# Patient Record
Sex: Female | Born: 1960 | Race: White | Hispanic: No | Marital: Single | Smoking: Never smoker
Health system: Southern US, Community
[De-identification: ages and names within clinical notes are randomized; demographics above are authoritative.]

## PROBLEM LIST (undated history)

## (undated) DIAGNOSIS — F419 Anxiety disorder, unspecified: Secondary | ICD-10-CM

## (undated) DIAGNOSIS — M069 Rheumatoid arthritis, unspecified: Secondary | ICD-10-CM

## (undated) DIAGNOSIS — Z8661 Personal history of infections of the central nervous system: Secondary | ICD-10-CM

## (undated) DIAGNOSIS — Z86010 Personal history of colonic polyps: Secondary | ICD-10-CM

## (undated) DIAGNOSIS — E119 Type 2 diabetes mellitus without complications: Secondary | ICD-10-CM

## (undated) DIAGNOSIS — I10 Essential (primary) hypertension: Secondary | ICD-10-CM

## (undated) HISTORY — DX: Personal history of colonic polyps: Z86.010

## (undated) HISTORY — PX: BREAST CYST EXCISION: SHX579

## (undated) HISTORY — DX: Rheumatoid arthritis, unspecified: M06.9

## (undated) HISTORY — PX: CYST EXCISION: SHX5701

## (undated) HISTORY — DX: Anxiety disorder, unspecified: F41.9

## (undated) HISTORY — PX: MOUTH SURGERY: SHX715

## (undated) HISTORY — PX: CHOLECYSTECTOMY: SHX55

---

## 2009-04-03 ENCOUNTER — Ambulatory Visit (HOSPITAL_COMMUNITY): Admission: RE | Admit: 2009-04-03 | Discharge: 2009-04-03 | Payer: Self-pay | Admitting: General Surgery

## 2009-05-05 ENCOUNTER — Ambulatory Visit (HOSPITAL_COMMUNITY): Admission: RE | Admit: 2009-05-05 | Discharge: 2009-05-05 | Payer: Self-pay | Admitting: General Surgery

## 2009-11-28 ENCOUNTER — Ambulatory Visit: Payer: Self-pay | Admitting: Cardiology

## 2010-06-07 LAB — HEPATIC FUNCTION PANEL
ALT: 19 U/L (ref 0–35)
AST: 19 U/L (ref 0–37)
Albumin: 3.5 g/dL (ref 3.5–5.2)
Alkaline Phosphatase: 78 U/L (ref 39–117)
Total Bilirubin: 0.6 mg/dL (ref 0.3–1.2)

## 2010-06-07 LAB — CBC
HCT: 37.2 % (ref 36.0–46.0)
Hemoglobin: 12 g/dL (ref 12.0–15.0)
WBC: 11.9 10*3/uL — ABNORMAL HIGH (ref 4.0–10.5)

## 2010-06-07 LAB — BASIC METABOLIC PANEL
GFR calc non Af Amer: >60 mL/min (ref >60)
Potassium: 3.9 mEq/L (ref 3.5–5.1)
Sodium: 136 mEq/L (ref 135–145)

## 2010-06-11 LAB — CBC
Hemoglobin: 12.1 g/dL (ref 12.0–15.0)
MCHC: 33 g/dL (ref 30.0–36.0)
RDW: 14.9 % (ref 11.5–15.5)

## 2010-06-11 LAB — BASIC METABOLIC PANEL
BUN: 6 mg/dL (ref 6–23)
CO2: 26 mEq/L (ref 19–32)
Calcium: 9.3 mg/dL (ref 8.4–10.5)
Creatinine, Ser: 0.51 mg/dL (ref 0.4–1.2)
Glucose, Bld: 149 mg/dL — ABNORMAL HIGH (ref 70–99)

## 2010-06-11 LAB — GLUCOSE, CAPILLARY: Glucose-Capillary: 140 mg/dL — ABNORMAL HIGH (ref 70–99)

## 2010-06-11 LAB — HEPATIC FUNCTION PANEL
Alkaline Phosphatase: 70 U/L (ref 39–117)
Indirect Bilirubin: 0.9 mg/dL (ref 0.3–0.9)
Total Protein: 6.9 g/dL (ref 6.0–8.3)

## 2015-09-25 ENCOUNTER — Emergency Department (HOSPITAL_COMMUNITY)
Admission: EM | Admit: 2015-09-25 | Discharge: 2015-09-25 | Disposition: A | Discharge status: Home / Self Care | Payer: Medicare Other | Attending: Emergency Medicine | Admitting: Emergency Medicine

## 2015-09-25 ENCOUNTER — Emergency Department (HOSPITAL_COMMUNITY): Payer: Medicare Other

## 2015-09-25 ENCOUNTER — Encounter (HOSPITAL_COMMUNITY): Payer: Self-pay | Admitting: *Deleted

## 2015-09-25 DIAGNOSIS — E119 Type 2 diabetes mellitus without complications: Secondary | ICD-10-CM | POA: Insufficient documentation

## 2015-09-25 DIAGNOSIS — M170 Bilateral primary osteoarthritis of knee: Secondary | ICD-10-CM | POA: Diagnosis not present

## 2015-09-25 DIAGNOSIS — I10 Essential (primary) hypertension: Secondary | ICD-10-CM | POA: Insufficient documentation

## 2015-09-25 DIAGNOSIS — Z79899 Other long term (current) drug therapy: Secondary | ICD-10-CM | POA: Insufficient documentation

## 2015-09-25 DIAGNOSIS — Z7984 Long term (current) use of oral hypoglycemic drugs: Secondary | ICD-10-CM | POA: Insufficient documentation

## 2015-09-25 DIAGNOSIS — M79662 Pain in left lower leg: Secondary | ICD-10-CM | POA: Diagnosis present

## 2015-09-25 HISTORY — DX: Type 2 diabetes mellitus without complications: E11.9

## 2015-09-25 HISTORY — DX: Personal history of infections of the central nervous system: Z86.61

## 2015-09-25 HISTORY — DX: Essential (primary) hypertension: I10

## 2015-09-25 LAB — COMPREHENSIVE METABOLIC PANEL
ALBUMIN: 3.1 g/dL — AB (ref 3.5–5.0)
ALK PHOS: 62 U/L (ref 38–126)
ALT: 9 U/L — AB (ref 14–54)
ANION GAP: 8 (ref 5–15)
AST: 15 U/L (ref 15–41)
BILIRUBIN TOTAL: 0.8 mg/dL (ref 0.3–1.2)
BUN: 12 mg/dL (ref 6–20)
CALCIUM: 8.8 mg/dL — AB (ref 8.9–10.3)
CO2: 24 mmol/L (ref 22–32)
Chloride: 103 mmol/L (ref 101–111)
Creatinine, Ser: 0.35 mg/dL — ABNORMAL LOW (ref 0.44–1.00)
GFR calc Af Amer: >60 mL/min (ref >60)
Glucose, Bld: 107 mg/dL — ABNORMAL HIGH (ref 65–99)
POTASSIUM: 3.9 mmol/L (ref 3.5–5.1)
SODIUM: 135 mmol/L (ref 135–145)
TOTAL PROTEIN: 7.5 g/dL (ref 6.5–8.1)

## 2015-09-25 LAB — CBC WITH DIFFERENTIAL/PLATELET
BASOS ABS: 0 10*3/uL (ref 0.0–0.1)
BASOS PCT: 0 %
EOS ABS: 0.1 10*3/uL (ref 0.0–0.7)
Eosinophils Relative: 1 %
HCT: 34 % — ABNORMAL LOW (ref 36.0–46.0)
HEMOGLOBIN: 11.1 g/dL — AB (ref 12.0–15.0)
LYMPHS ABS: 2.2 10*3/uL (ref 0.7–4.0)
Lymphocytes Relative: 21 %
MCH: 26.6 pg (ref 26.0–34.0)
MCHC: 32.6 g/dL (ref 30.0–36.0)
MCV: 81.5 fL (ref 78.0–100.0)
Monocytes Absolute: 1 10*3/uL (ref 0.1–1.0)
Monocytes Relative: 10 %
NEUTROS PCT: 68 %
Neutro Abs: 7.1 10*3/uL (ref 1.7–7.7)
Platelets: 381 10*3/uL (ref 150–400)
RBC: 4.17 MIL/uL (ref 3.87–5.11)
RDW: 14.1 % (ref 11.5–15.5)
WBC: 10.5 10*3/uL (ref 4.0–10.5)

## 2015-09-25 LAB — URIC ACID: URIC ACID, SERUM: 3.8 mg/dL (ref 2.3–6.6)

## 2015-09-25 MED ORDER — HYDROMORPHONE HCL 1 MG/ML IJ SOLN
1.0000 mg | Freq: Once | INTRAMUSCULAR | Status: AC
  Administered 2015-09-25: 1 mg via INTRAVENOUS
  Filled 2015-09-25: qty 1

## 2015-09-25 MED ORDER — PREDNISONE 20 MG PO TABS: ORAL_TABLET | ORAL | Status: DC

## 2015-09-25 MED ORDER — HYDROCODONE-ACETAMINOPHEN 5-325 MG PO TABS: 1.0000 | ORAL_TABLET | Freq: Four times a day (QID) | ORAL | Status: AC | PRN

## 2015-09-25 MED ORDER — METHYLPREDNISOLONE SODIUM SUCC 125 MG IJ SOLR
125.0000 mg | Freq: Once | INTRAMUSCULAR | Status: AC
  Administered 2015-09-25: 125 mg via INTRAVENOUS
  Filled 2015-09-25: qty 2

## 2015-09-25 NOTE — ED Notes (Addendum)
Pt c/o pain and weakness in bilateral legs, mainly the left, over the past month. Pt denies slurred speech, grips equal bilaterally in her arms. No facial droop. Pt reports she was seen by her PCP recently and was given new medication for her arthritis. Pt reports her pain and weakness in her legs has gotten worse since she started on the new arthritis weakness. Pt is noted to have bilateral arm tremors, which pt reports is not her normal. Pt is having trouble ambulating and unable to move legs enough to walk.

## 2015-09-25 NOTE — ED Provider Notes (Signed)
CSN: [id number]     Arrival date & time 09/25/15  1004 History  By signing my name below, I, Jasmyn B. Alexander, attest that this documentation has been prepared under the direction and in the presence of Milton Ferguson, MD.  Electronically Signed: Tedra Coupe. Sheppard Coil, ED Scribe. 09/25/2015. 11:20 AM.   Chief Complaint  Patient presents with  . Extremity Weakness    Patient is a 55 y.o. female presenting with extremity weakness. The history is provided by the patient. No language interpreter was used.  Extremity Weakness This is a recurrent problem. The current episode started 12 to 24 hours ago. The problem occurs constantly. The problem has been gradually worsening. Pertinent negatives include no chest pain, no abdominal pain and no headaches. The symptoms are aggravated by exertion. Relieved by: Use of walker. She has tried a warm compress for the symptoms. The treatment provided no relief.    HPI Comments: Kirsten Carter is a 55 y.o. female with PMHx of rheumatoid and osteoarthritis who presents to the Emergency Department complaining of gradual onset, constant, worsening bilateral lower extremity pain x 1 day. Pt states this is a recurrent problem that she has been having for the past month, but she notes that when she woke up on 09/25/15 she was unable to ambulate at all due to pain in lower extremities. She also has associated weakness of bilateral legs, mostly in her left leg. She reports intermittently using a walker with mild relief of pain. Per pt's relative, pt was recently started on Diclofenac which she believes has caused arthritis pain to progressively worsen. She notes applying a warm compress on legs at 4am on 09/25/15 with no relief of pain. Pt states within 3 months, she has lost about 15 lbs. Denies any back pain.  Past Medical History  Diagnosis Date  . Diabetes mellitus without complication ([place])   . Hypertension   . History of bacterial meningitis    Past Surgical History   Procedure Laterality Date  . Cholecystectomy    . Cyst excision      right arm cyst excision  . Breast cyst excision Right    No family history on file. Social History  Substance Use Topics  . Smoking status: Never Smoker   . Smokeless tobacco: None  . Alcohol Use: No   OB History    Gravida Para Term Preterm AB TAB SAB Ectopic Multiple Living   0 0 0 0 0 0 0 0 0 0      Review of Systems  Constitutional: Positive for unexpected weight change. Negative for appetite change and fatigue.  HENT: Negative for congestion, ear discharge and sinus pressure.   Eyes: Negative for discharge.  Respiratory: Negative for cough.   Cardiovascular: Negative for chest pain.  Gastrointestinal: Negative for abdominal pain and diarrhea.  Genitourinary: Negative for frequency and hematuria.  Musculoskeletal: Positive for myalgias, arthralgias and extremity weakness. Negative for back pain.  Skin: Negative for rash.  Neurological: Positive for weakness. Negative for seizures, facial asymmetry, speech difficulty and headaches.  Psychiatric/Behavioral: Negative for hallucinations.    Allergies  Fish allergy and Penicillins  Home Medications   Prior to Admission medications   Medication Sig Start Date End Date Taking? Authorizing Provider  chlordiazePOXIDE (LIBRIUM) 5 MG capsule Take 5 mg by mouth daily as needed for anxiety.   Yes Historical Provider, MD  diclofenac (VOLTAREN) 50 MG EC tablet TAKE ONE TABLET BY MOUTH TWICE DAILY WITH FOOD FOR ARTHRITIS 07/22/15  Yes Historical Provider,  MD  glipiZIDE (GLUCOTROL XL) 5 MG 24 hr tablet TAKE ONE TABLET BY MOUTH ONCE DAILY FOR DIABETES 09/10/15  Yes Historical Provider, MD  metFORMIN (GLUCOPHAGE-XR) 500 MG 24 hr tablet TAKE TWO (2) TABLETS BY MOUTH TWICE DAILY WITH FOOD FOR DIABETES 09/03/15  Yes Historical Provider, MD  ramipril (ALTACE) 5 MG capsule TAKE ONE CAPSULE BY MOUTH IN THE MORNING FOR BLOOD PRESSURE 09/10/15  Yes Historical Provider, MD   BP  121/66 mmHg  Pulse 82  Resp 15  SpO2 96% Physical Exam  Constitutional: She is oriented to person, place, and time. She appears well-developed.  HENT:  Head: Normocephalic.  Eyes: Conjunctivae and EOM are normal. No scleral icterus.  Neck: Neck supple. No tracheal deviation present. No thyromegaly present.  Cardiovascular: Normal rate and regular rhythm.  Exam reveals no gallop and no friction rub.   No murmur heard. Pulmonary/Chest: No stridor. She has no wheezes. She has no rales. She exhibits no tenderness.  Abdominal: She exhibits no distension. There is no tenderness. There is no rebound.  Musculoskeletal: She exhibits edema and tenderness.  Minor swelling and tenderness in bilateral ankles, knees, wrists, and elbows  Lymphadenopathy:    She has no cervical adenopathy.  Neurological: She is oriented to person, place, and time. She exhibits normal muscle tone. Coordination normal.  Skin: Skin is warm. No rash noted. No erythema.  Psychiatric: She has a normal mood and affect. Her behavior is normal.    ED Course  Procedures (including critical care time) DIAGNOSTIC STUDIES: Oxygen Saturation is 96% on RA, adequate by my interpretation.    COORDINATION OF CARE: 11:10 AM-Discussed treatment plan which includes X-ray of right knee and lumbar spine, CBC, CMP, and Uric acid with pt at bedside and pt agreed to plan. Will order Dilaudid and Solu-medrol injection.   Labs Review Labs Reviewed  CBC WITH DIFFERENTIAL/PLATELET - Abnormal; Notable for the following:    Hemoglobin 11.1 (*)    HCT 34.0 (*)    All other components within normal limits  COMPREHENSIVE METABOLIC PANEL  URIC ACID   Imaging Review Dg Lumbar Spine Complete  09/25/2015  CLINICAL DATA:  Low back pain. EXAM: LUMBAR SPINE - COMPLETE 4+ VIEW COMPARISON:  None. FINDINGS: Transitional lumbosacral anatomy with rudimentary S1-2 disc space. Negative for acute fracture or subluxation. No endplate erosion or focal bone  lesion. Minor endplate spurring. No degenerative disc narrowing or notable facet arthropathy. No visible spondylolysis. Punctate calcification in the left pelvis is likely phlebolith. Presumed cholecystectomy clips. IMPRESSION: No acute finding or notable degenerative change. Electronically Signed   By: Monte Fantasia M.D.   On: 09/25/2015 12:03   Dg Knee Complete 4 Views Left  09/25/2015  CLINICAL DATA:  Bilateral knee pain, low back pain EXAM: LEFT KNEE - COMPLETE 4+ VIEW COMPARISON:  None. FINDINGS: Four views of the left knee submitted. There is diffuse mild narrowing of joint space. Spurring of medial femoral condyle and medial tibial plateau. Narrowing of patellofemoral joint space. Moderate joint effusion. Mild spurring of patella. No acute fracture or subluxation. IMPRESSION: No acute fracture or subluxation. Osteoarthritic changes as described above. Moderate joint effusion. Electronically Signed   By: Lahoma Crocker M.D.   On: 09/25/2015 12:03   Dg Knee Complete 4 Views Right  09/25/2015  CLINICAL DATA:  Low back pain, bilateral knee pain EXAM: RIGHT KNEE - COMPLETE 4+ VIEW COMPARISON:  None. FINDINGS: Four views of the right knee submitted. There is diffuse narrowing of joint compartment most significant  laterally. Mild spurring of medial and lateral femoral condyle. Mild spurring of medial tibial plateau. Narrowing of patellofemoral joint space. Moderate joint effusion. No acute fracture or subluxation. IMPRESSION: No acute fracture or subluxation. Osteoarthritic changes as described above. Electronically Signed   By: Lahoma Crocker M.D.   On: 09/25/2015 12:02   I have personally reviewed and evaluated these images and lab results as part of my medical decision-making.  MDM   Final diagnoses:  None   Patient with severe osteoarthritis.  Patient thinks she is having a reaction to her voltaren which she recently started. Patient will hold the Voltaren for now she started on Vicodin and prednisone for  3 days she will follow-up with her PCP next week  The chart was scribed for me under my direct supervision.  I personally performed the history, physical, and medical decision making and all procedures in the evaluation of this patient.Milton Ferguson, MD 09/25/15 1346

## 2015-09-25 NOTE — ED Notes (Signed)
MD at bedside. 

## 2015-09-25 NOTE — Discharge Instructions (Signed)
Follow up with your md next week. °

## 2015-12-01 ENCOUNTER — Other Ambulatory Visit (HOSPITAL_COMMUNITY)
Admission: RE | Admit: 2015-12-01 | Discharge: 2015-12-01 | Disposition: A | Discharge status: Home / Self Care | Payer: Medicare Other | Source: Ambulatory Visit | Attending: Rheumatology | Admitting: Rheumatology

## 2015-12-01 DIAGNOSIS — Z79899 Other long term (current) drug therapy: Secondary | ICD-10-CM | POA: Insufficient documentation

## 2015-12-01 DIAGNOSIS — M79641 Pain in right hand: Secondary | ICD-10-CM | POA: Diagnosis present

## 2015-12-01 DIAGNOSIS — M79642 Pain in left hand: Secondary | ICD-10-CM | POA: Diagnosis present

## 2015-12-01 LAB — CBC
HCT: 38.2 % (ref 36.0–46.0)
Hemoglobin: 12 g/dL (ref 12.0–15.0)
MCH: 27.5 pg (ref 26.0–34.0)
MCHC: 31.4 g/dL (ref 30.0–36.0)
MCV: 87.6 fL (ref 78.0–100.0)
PLATELETS: 312 10*3/uL (ref 150–400)
RBC: 4.36 MIL/uL (ref 3.87–5.11)
RDW: 17.2 % — AB (ref 11.5–15.5)
WBC: 11 10*3/uL — AB (ref 4.0–10.5)

## 2015-12-01 LAB — COMPREHENSIVE METABOLIC PANEL
ALBUMIN: 3.6 g/dL (ref 3.5–5.0)
ALT: 12 U/L — AB (ref 14–54)
AST: 10 U/L — AB (ref 15–41)
Alkaline Phosphatase: 61 U/L (ref 38–126)
Anion gap: 8 (ref 5–15)
BUN: 11 mg/dL (ref 6–20)
CHLORIDE: 101 mmol/L (ref 101–111)
CO2: 28 mmol/L (ref 22–32)
CREATININE: 0.48 mg/dL (ref 0.44–1.00)
Calcium: 9.4 mg/dL (ref 8.9–10.3)
GFR calc Af Amer: >60 mL/min (ref >60)
GLUCOSE: 122 mg/dL — AB (ref 65–99)
POTASSIUM: 3.3 mmol/L — AB (ref 3.5–5.1)
Sodium: 137 mmol/L (ref 135–145)
Total Bilirubin: 0.3 mg/dL (ref 0.3–1.2)
Total Protein: 7.1 g/dL (ref 6.5–8.1)

## 2015-12-02 LAB — RHEUMATOID FACTOR: Rhuematoid fact SerPl-aCnc: 56.2 IU/mL — ABNORMAL HIGH (ref 0.0–13.9)

## 2015-12-04 LAB — CYCLIC CITRUL PEPTIDE ANTIBODY, IGG/IGA: CCP Antibodies IgG/IgA: >250 units — ABNORMAL HIGH (ref 0–19)

## 2016-02-23 ENCOUNTER — Other Ambulatory Visit (HOSPITAL_COMMUNITY)
Admission: RE | Admit: 2016-02-23 | Discharge: 2016-02-23 | Disposition: A | Discharge status: Home / Self Care | Payer: Medicare Other | Source: Ambulatory Visit | Attending: Rheumatology | Admitting: Rheumatology

## 2016-02-23 DIAGNOSIS — M0609 Rheumatoid arthritis without rheumatoid factor, multiple sites: Secondary | ICD-10-CM | POA: Diagnosis present

## 2016-02-23 DIAGNOSIS — Z79899 Other long term (current) drug therapy: Secondary | ICD-10-CM | POA: Diagnosis present

## 2016-02-23 LAB — COMPREHENSIVE METABOLIC PANEL
ALBUMIN: 3.7 g/dL (ref 3.5–5.0)
ALT: 16 U/L (ref 14–54)
AST: 13 U/L — AB (ref 15–41)
Alkaline Phosphatase: 72 U/L (ref 38–126)
Anion gap: 5 (ref 5–15)
BUN: 17 mg/dL (ref 6–20)
CHLORIDE: 103 mmol/L (ref 101–111)
CO2: 29 mmol/L (ref 22–32)
Calcium: 9.1 mg/dL (ref 8.9–10.3)
Creatinine, Ser: 0.65 mg/dL (ref 0.44–1.00)
GFR calc Af Amer: >60 mL/min (ref >60)
GFR calc non Af Amer: >60 mL/min (ref >60)
GLUCOSE: 93 mg/dL (ref 65–99)
POTASSIUM: 3.8 mmol/L (ref 3.5–5.1)
Sodium: 137 mmol/L (ref 135–145)
Total Bilirubin: 0.4 mg/dL (ref 0.3–1.2)
Total Protein: 7 g/dL (ref 6.5–8.1)

## 2016-02-23 LAB — CBC
HCT: 39.5 % (ref 36.0–46.0)
Hemoglobin: 12.9 g/dL (ref 12.0–15.0)
MCH: 30.3 pg (ref 26.0–34.0)
MCHC: 32.7 g/dL (ref 30.0–36.0)
MCV: 92.7 fL (ref 78.0–100.0)
PLATELETS: 276 10*3/uL (ref 150–400)
RBC: 4.26 MIL/uL (ref 3.87–5.11)
RDW: 15.3 % (ref 11.5–15.5)
WBC: 12.4 10*3/uL — AB (ref 4.0–10.5)

## 2016-03-22 DIAGNOSIS — C50919 Malignant neoplasm of unspecified site of unspecified female breast: Secondary | ICD-10-CM

## 2016-03-22 HISTORY — PX: BREAST SURGERY: SHX581

## 2016-03-22 HISTORY — DX: Malignant neoplasm of unspecified site of unspecified female breast: C50.919

## 2018-06-22 IMAGING — DX DG LUMBAR SPINE COMPLETE 4+V
5 series · 5 of 5 positions shown · non-contrast
Comparison: None.

CLINICAL DATA: Low back pain.

EXAM:
LUMBAR SPINE - COMPLETE 4+ VIEW

[l-spine ap]
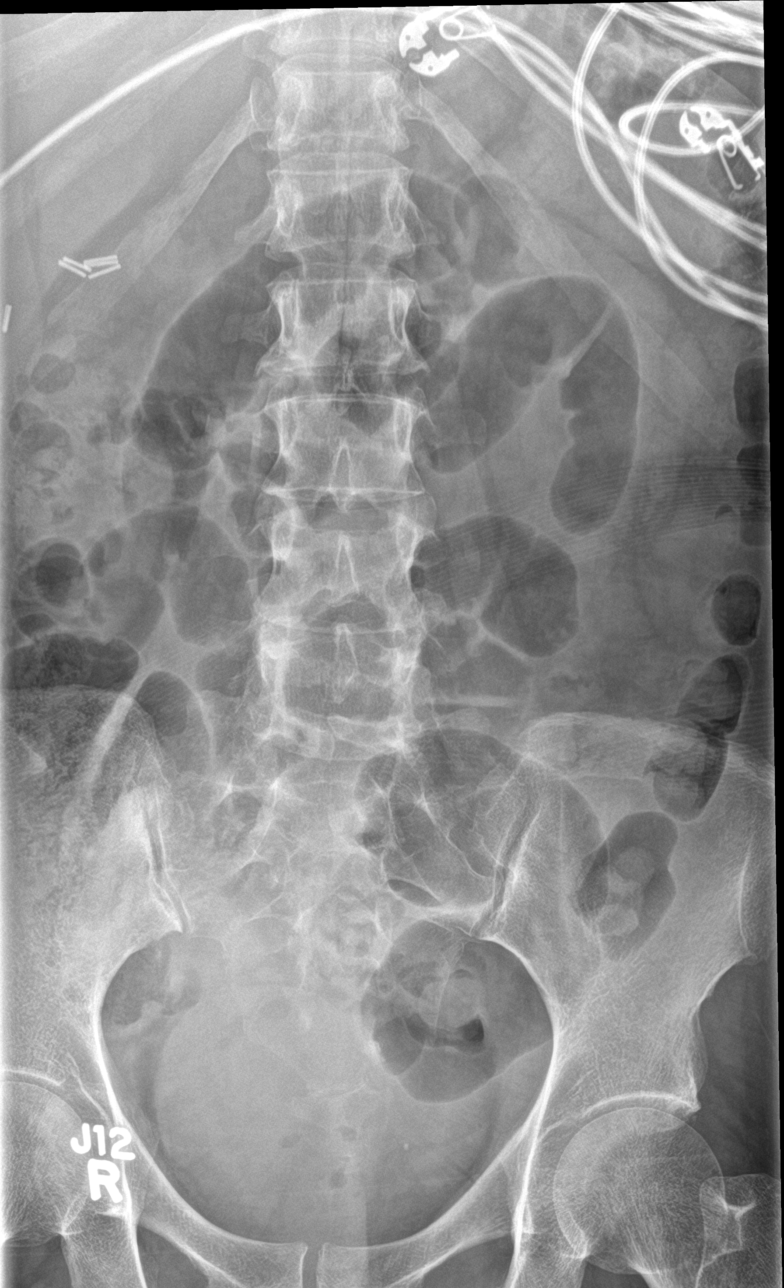

[l-spine obl (1 of 2)]
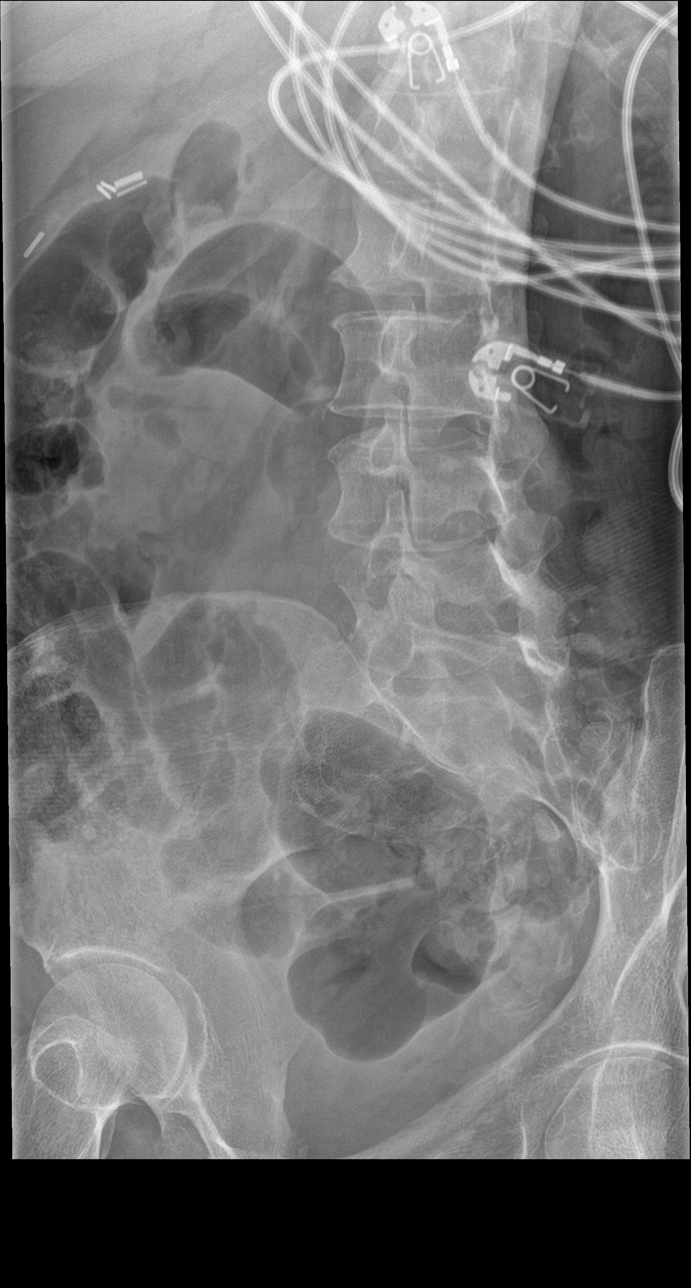

[l-spine obl (2 of 2)]
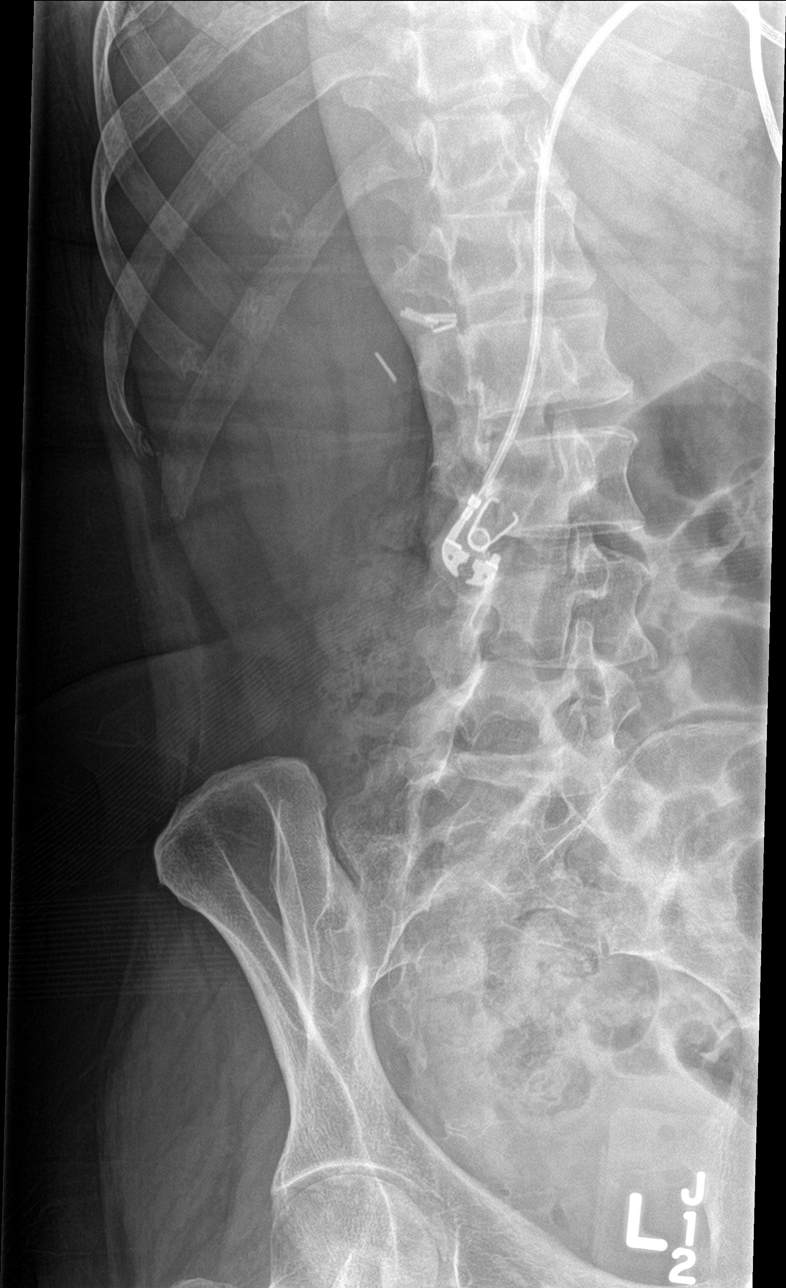

[l-spine lat]
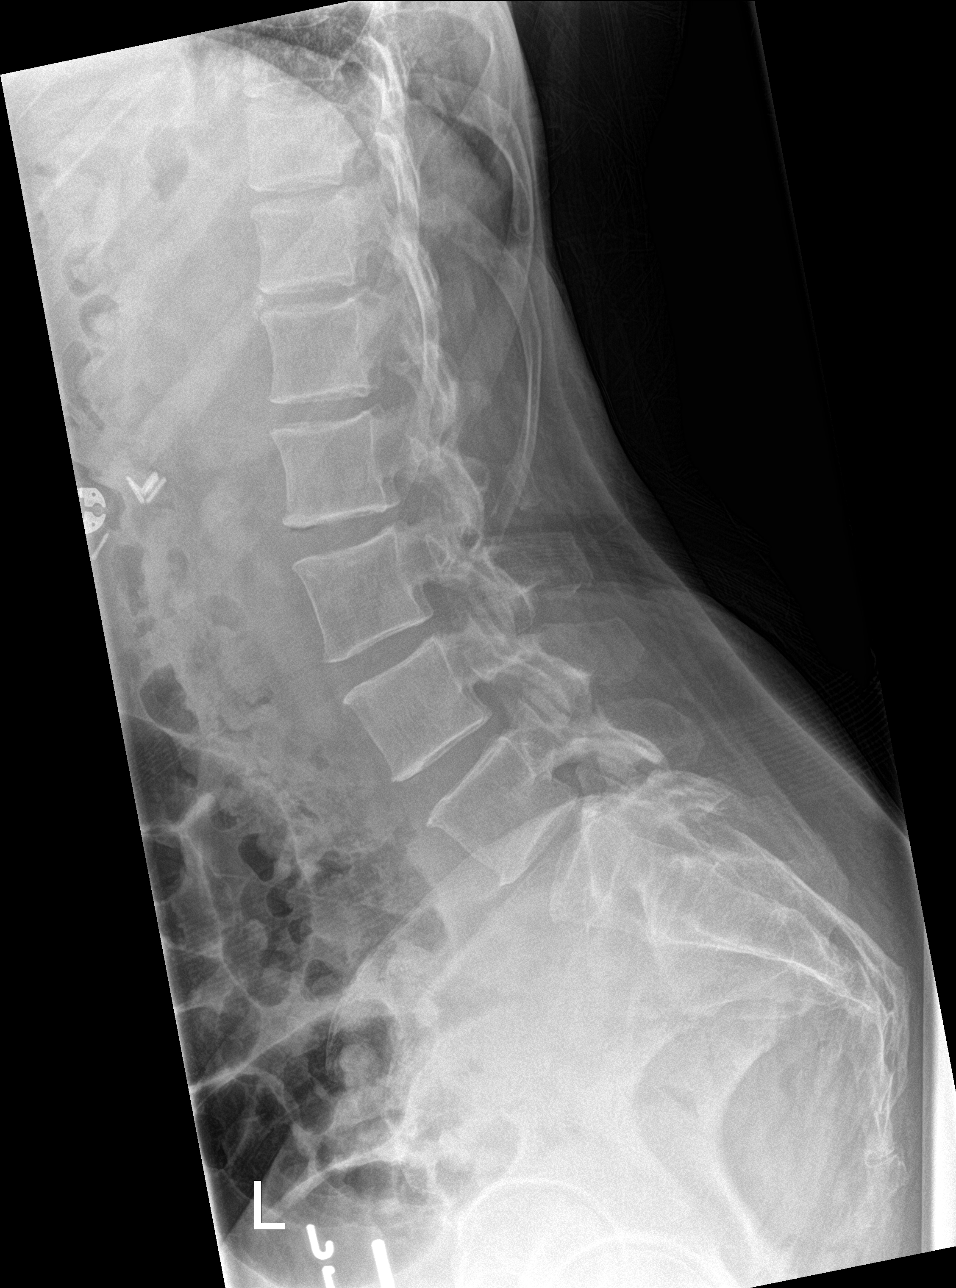

[l-spine spot]
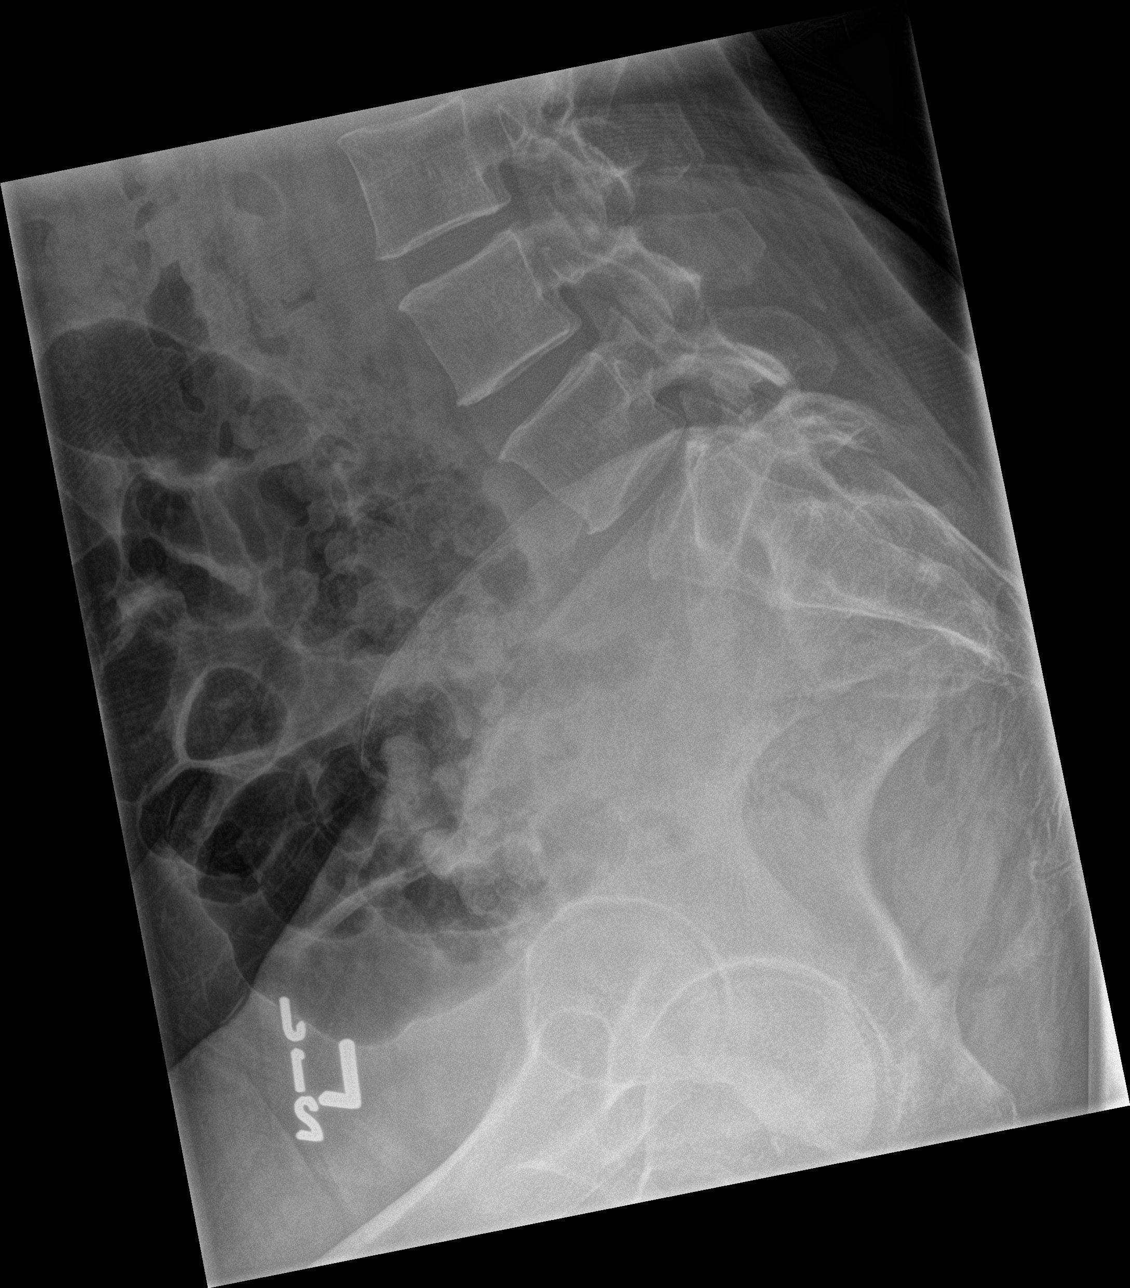

[5 of 5 positions shown; findings below may reference images not displayed]

FINDINGS: Transitional lumbosacral anatomy with rudimentary S1-2 disc space.

Negative for acute fracture or subluxation. No endplate erosion or
focal bone lesion. Minor endplate spurring. No degenerative disc
narrowing or notable facet arthropathy. No visible spondylolysis.
Punctate calcification in the left pelvis is likely phlebolith.
Presumed cholecystectomy clips.
IMPRESSION: No acute finding or notable degenerative change.

## 2021-10-20 HISTORY — PX: COLONOSCOPY: SHX174

## 2021-11-26 ENCOUNTER — Encounter: Payer: Self-pay | Admitting: *Deleted

## 2021-12-10 ENCOUNTER — Ambulatory Visit: Payer: Medicare Other | Admitting: Gastroenterology

## 2021-12-25 ENCOUNTER — Encounter: Payer: Self-pay | Admitting: Gastroenterology

## 2021-12-25 ENCOUNTER — Ambulatory Visit (INDEPENDENT_AMBULATORY_CARE_PROVIDER_SITE_OTHER): Payer: Medicare Other | Admitting: Gastroenterology

## 2021-12-25 DIAGNOSIS — K6289 Other specified diseases of anus and rectum: Secondary | ICD-10-CM | POA: Insufficient documentation

## 2021-12-25 NOTE — Progress Notes (Signed)
GI Office Note    Referring Provider: Adelina Mings, MD Primary Care Physician:  Glenda Chroman, MD  Primary Gastroenterologist: Garfield Cornea, MD   Chief Complaint   Chief Complaint  Patient presents with   proctitis     History of Present Illness   Kirsten Carter is a 61 y.o. female presenting today at the request of Dr. Rocco Serene for further evaluation of proctitis.   Patient underwent surveillance colonoscopy for history of adenomatous colon polyps back in August.  She was found to have 4 tubular adenomas.  Also noted to have localized mild inflammation characterized by erythema found in the rectum.  Pathology revealed acute colitis, no dysplasia or malignancy.  Differential diagnosis includes infectious and ischemic colitis.  Patient states she was not having any bowel issues prior to her colonoscopy.  Most the time her stools are solid.  Denies any blood in the stool, tenesmus, rectal pain, abdominal pain.  Occasionally has diarrhea but says this is rare.  No upper GI symptoms.  She used Dulcolax/MiraLAX/Gatorade prep, no enema.    Medications   Current Outpatient Medications  Medication Sig Dispense Refill   Acetaminophen 500 MG capsule 1 capsule as needed Orally every 6 hrs     atorvastatin (LIPITOR) 10 MG tablet 1 tablet Orally Once a day for 30 day(s)     diclofenac (VOLTAREN) 50 MG EC tablet TAKE ONE TABLET BY MOUTH TWICE DAILY WITH FOOD FOR ARTHRITIS  2   doxycycline (VIBRAMYCIN) 100 MG capsule Take 100 mg by mouth 2 (two) times daily.     empagliflozin (JARDIANCE) 10 MG TABS tablet 1 tablet Orally Once a day for 30 day(s)     folic acid (FOLVITE) 1 MG tablet 1 tablet Orally Once a day for 30 days     glipiZIDE (GLUCOTROL XL) 5 MG 24 hr tablet TAKE ONE TABLET BY MOUTH ONCE DAILY FOR DIABETES  11   HYDROcodone-acetaminophen (NORCO/VICODIN) 5-325 MG tablet Take 1 tablet by mouth every 6 (six) hours as needed. 20 tablet 0   hydroxychloroquine (PLAQUENIL) 200  MG tablet 2 tablets with food or milk Orally Once a day for 30 days     insulin glargine (LANTUS) 100 UNIT/ML injection 25 units Subcutaneous at bedtime     Insulin Syringe-Needle U-100 (RELION INSULIN SYRINGE) 31G X 15/64" 0.5 ML MISC      letrozole (FEMARA) 2.5 MG tablet 1 tablet Orally Once a day for 30 day(s)     loratadine (CLARITIN) 10 MG tablet 1 tablet Orally Once a day     metFORMIN (GLUCOPHAGE-XR) 500 MG 24 hr tablet TAKE TWO (2) TABLETS BY MOUTH TWICE DAILY WITH FOOD FOR DIABETES  11   methotrexate (RHEUMATREX) 2.5 MG tablet 5 tabs Orally two days a week for 30 days     chlordiazePOXIDE (LIBRIUM) 5 MG capsule Take 5 mg by mouth daily as needed for anxiety. (Patient not taking: Reported on 12/25/2021)     No current facility-administered medications for this visit.    Allergies   Allergies as of 12/25/2021 - Review Complete 12/25/2021  Allergen Reaction Noted   Fish allergy Anaphylaxis 09/25/2015   Penicillins Anaphylaxis 84/13/2440   Garlic Rash 01/16/2535    Past Medical History   Past Medical History:  Diagnosis Date   Anxiety    Breast cancer ([place]) 2018   Diabetes mellitus without complication ([place])    History of bacterial meningitis    Hx of adenomatous colonic polyps    Hypertension  RA (rheumatoid arthritis) ([facility])     Past Surgical History   Past Surgical History:  Procedure Laterality Date   BREAST CYST EXCISION Right    BREAST SURGERY  2018   cancer   CHOLECYSTECTOMY     COLONOSCOPY  10/2021   CYST EXCISION     right arm cyst excision   MOUTH SURGERY     age 49, for cross bite and under bit    Past Family History   Family History  Problem Relation Age of Onset   Cancer - Ovarian Mother    Cancer Maternal Grandmother        ?leukemia   Cancer Maternal Aunt    Cancer Maternal Aunt    Colon cancer Neg Hx     Past Social History   Social History   Socioeconomic History   Marital status: Single    Spouse name: Not on file   Number of  children: Not on file   Years of education: Not on file   Highest education level: Not on file  Occupational History   Not on file  Tobacco Use   Smoking status: Never   Smokeless tobacco: Not on file  Substance and Sexual Activity   Alcohol use: No   Drug use: No   Sexual activity: Not Currently  Other Topics Concern   Not on file  Social History Narrative   Not on file   Social Determinants of Health   Financial Resource Strain: Not on file  Food Insecurity: Not on file  Transportation Needs: Not on file  Physical Activity: Not on file  Stress: Not on file  Social Connections: Not on file  Intimate Partner Violence: Not on file    Review of Systems   General: Negative for anorexia, weight loss, fever, chills, fatigue, weakness. Eyes: Negative for vision changes.  ENT: Negative for hoarseness, difficulty swallowing , nasal congestion. CV: Negative for chest pain, angina, palpitations, dyspnea on exertion, peripheral edema.  Respiratory: Negative for dyspnea at rest, dyspnea on exertion, cough, sputum, wheezing.  GI: See history of present illness. GU:  Negative for dysuria, hematuria, urinary incontinence, urinary frequency, nocturnal urination.  MS: Negative for  low back pain. +joint pain Derm: Negative for rash or itching.  Neuro: Negative for weakness, abnormal sensation, seizure, frequent headaches, memory loss,  confusion.  Psych: Negative for anxiety, depression, suicidal ideation, hallucinations.  Endo: Negative for unusual weight change.  Heme: Negative for bruising or bleeding. Allergy: Negative for rash or hives.  Physical Exam   BP (!) 163/89 (BP Location: Left Arm, Patient Position: Sitting, Cuff Size: Normal)   Pulse 63   Temp 98 F (36.7 C) (Oral)   Ht '4\' 11"'$  (1.499 m)   Wt 126 lb (57.2 kg)   SpO2 96%   BMI 25.45 kg/m    General: Well-nourished, well-developed in no acute distress.  Head: Normocephalic, atraumatic.   Eyes: Conjunctiva pink,  no icterus. Mouth: Oropharyngeal mucosa moist and pink. Neck: Supple without thyromegaly, masses, or lymphadenopathy.  Lungs: Clear to auscultation bilaterally.  Heart: Regular rate and rhythm, no murmurs rubs or gallops.  Abdomen: Bowel sounds are normal, nontender, nondistended, no hepatosplenomegaly or masses,  no abdominal bruits or hernia, no rebound or guarding.   Rectal: not performed Extremities: No lower extremity edema. No clubbing or deformities.  Neuro: Alert and oriented x 4 , grossly normal neurologically.  Skin: Warm and dry, no rash or jaundice.   Psych: Alert and cooperative, normal mood and affect.  Labs   None available  Imaging Studies   No results found.  Assessment   Proctitis: asymptomatic. Findings noted at time of surveillance colonoscopy for history of colon polyps. No symptoms to suggest infectious etiology. She denies chronic symptoms suggestive of IBD. Query findings related to bowel prep. Suspect benign etiology. She has colored pictures of her colonoscopy at home and will drop off at the office next week for review.     PLAN   Will review images as available with Dr. Gala Romney. Further recommendations to follow.  If she develops rectal bleeding, tenesmus, frequent diarrhea, rectal pain, she should let us know.    Laureen Ochs. Bobby Rumpf, [place], [facility]

## 2021-12-25 NOTE — Patient Instructions (Signed)
Please drop off the color pictures from your colonoscopy for review. We will let you know what Dr. Kandis Mannan. Gala Romney advise once pictures reviewed.

## 2022-01-14 ENCOUNTER — Telehealth: Payer: Self-pay | Admitting: Gastroenterology

## 2022-01-14 NOTE — Telephone Encounter (Signed)
Please let patient know that I reviewed the colored pictures of her colonoscopy with Dr. Gala Romney.   He suspects the mild area of inflammation seen in the rectum is likely benign given that she was/is not having any symptoms. However, he did given the disclaimer that he can't be certain since he did not complete her procedure and is basing his opinion off of the one picture of the area of concern provided.   He recommended watching for any signs of diarrhea, blood in the stool, urgency to have BM, rectal pain. If develops any of these symptoms she may require repeat colonoscopy.    She should call us if needed.   She can come by and pick up her report or we can mail it to her.

## 2022-01-14 NOTE — Telephone Encounter (Signed)
Pt was made aware and verbalized understanding. Pt is requesting her copy to be mailed to her.
